# Patient Record
Sex: Male | Born: 1985 | Race: White | Hispanic: No | Marital: Married | Smoking: Never smoker
Health system: Southern US, Community
[De-identification: ages and names within clinical notes are randomized; demographics above are authoritative.]

---

## 2012-10-08 ENCOUNTER — Emergency Department (HOSPITAL_BASED_OUTPATIENT_CLINIC_OR_DEPARTMENT_OTHER)
Admission: EM | Admit: 2012-10-08 | Discharge: 2012-10-08 | Disposition: A | Discharge status: Home / Self Care | Payer: Worker's Compensation | Attending: Emergency Medicine | Admitting: Emergency Medicine

## 2012-10-08 ENCOUNTER — Encounter (HOSPITAL_BASED_OUTPATIENT_CLINIC_OR_DEPARTMENT_OTHER): Payer: Self-pay

## 2012-10-08 ENCOUNTER — Emergency Department (HOSPITAL_BASED_OUTPATIENT_CLINIC_OR_DEPARTMENT_OTHER): Payer: Worker's Compensation

## 2012-10-08 DIAGNOSIS — Y939 Activity, unspecified: Secondary | ICD-10-CM | POA: Insufficient documentation

## 2012-10-08 DIAGNOSIS — Z23 Encounter for immunization: Secondary | ICD-10-CM | POA: Insufficient documentation

## 2012-10-08 DIAGNOSIS — W269XXA Contact with unspecified sharp object(s), initial encounter: Secondary | ICD-10-CM | POA: Insufficient documentation

## 2012-10-08 DIAGNOSIS — Y929 Unspecified place or not applicable: Secondary | ICD-10-CM | POA: Insufficient documentation

## 2012-10-08 DIAGNOSIS — S81012A Laceration without foreign body, left knee, initial encounter: Secondary | ICD-10-CM

## 2012-10-08 DIAGNOSIS — S81009A Unspecified open wound, unspecified knee, initial encounter: Secondary | ICD-10-CM | POA: Insufficient documentation

## 2012-10-08 MED ORDER — TETANUS-DIPHTH-ACELL PERTUSSIS 5-2.5-18.5 LF-MCG/0.5 IM SUSP
0.5000 mL | Freq: Once | INTRAMUSCULAR | Status: AC
  Administered 2012-10-08: 0.5 mL via INTRAMUSCULAR
  Filled 2012-10-08: qty 0.5

## 2012-10-08 NOTE — ED Notes (Signed)
Suture cart is at the bedside set up and ready for the doctor to use. 

## 2012-10-08 NOTE — ED Notes (Signed)
Laceration to left knee.

## 2012-10-08 NOTE — ED Provider Notes (Signed)
CSN: [id number]     Arrival date & time 10/08/12  1059 History   First MD Initiated Contact with Patient 10/08/12 1211     Chief Complaint  Patient presents with  . Extremity Laceration   (Consider location/radiation/quality/duration/timing/severity/associated sxs/prior Treatment) Patient is a 27 y.o. male presenting with knee pain. The history is provided by the patient. No language interpreter was used.  Knee Pain Location:  Knee Knee location:  L knee Pain details:    Radiates to:  L leg   Severity:  No pain   Onset quality:  Sudden   Duration:  1 hour Chronicity:  New Foreign body present:  No foreign bodies Tetanus status:  Out of date Relieved by:  Nothing Worsened by:  Nothing tried Ineffective treatments:  None tried Pt has 2 cm laceration to left knee  History reviewed. No pertinent past medical history. History reviewed. No pertinent past surgical history. No family history on file. History  Substance Use Topics  . Smoking status: Never Smoker   . Smokeless tobacco: Not on file  . Alcohol Use: No    Review of Systems  Skin: Positive for wound.  All other systems reviewed and are negative.    Allergies  Review of patient's allergies indicates no known allergies.  Home Medications  No current outpatient prescriptions on file. BP 122/81  Pulse 64  Temp(Src) 98.2 F (36.8 C) (Oral)  Resp 16  Ht 6\' 1"  (1.854 m)  Wt 182 lb (82.555 kg)  BMI 24.02 kg/m2  SpO2 100% Physical Exam  Nursing note and vitals reviewed. Constitutional: He is oriented to person, place, and time. He appears well-developed and well-nourished.  HENT:  Head: Normocephalic.  Left Ear: External ear normal.  Neck: Normal range of motion.  Cardiovascular: Normal rate and normal heart sounds.   Pulmonary/Chest: Effort normal and breath sounds normal.  Abdominal: Soft.  Musculoskeletal: He exhibits tenderness.  Neurological: He is alert and oriented to person, place, and time. He has  normal reflexes.  Skin: There is erythema.  Psychiatric: He has a normal mood and affect.    ED Course  LACERATION REPAIR Date/Time: 10/08/2012 12:39 PM Performed by: Elson Areas Authorized by: Elson Areas Consent: Verbal consent obtained. Risks and benefits: risks, benefits and alternatives were discussed Required items: required blood products, implants, devices, and special equipment available Laceration length: 2 cm Foreign bodies: no foreign bodies Vascular damage: no Anesthesia: local infiltration Preparation: Patient was prepped and draped in the usual sterile fashion. Irrigation solution: saline Skin closure: staples Number of sutures: 2 Technique: simple Approximation: loose Approximation difficulty: simple Patient tolerance: Patient tolerated the procedure well with no immediate complications.   (including critical care time) Labs Review Labs Reviewed - No data to display Imaging Review No results found.  MDM  No diagnosis found.    Lonia Skinner [place], PA-C 10/08/12 1247

## 2012-10-09 NOTE — ED Provider Notes (Signed)
Medical screening examination/treatment/procedure(s) were performed by non-physician practitioner and as supervising physician I was immediately available for consultation/collaboration.   Junius Argyle, MD 10/09/12 1144

## 2012-10-18 ENCOUNTER — Encounter (HOSPITAL_BASED_OUTPATIENT_CLINIC_OR_DEPARTMENT_OTHER): Payer: Self-pay

## 2012-10-18 ENCOUNTER — Emergency Department (HOSPITAL_BASED_OUTPATIENT_CLINIC_OR_DEPARTMENT_OTHER)
Admission: EM | Admit: 2012-10-18 | Discharge: 2012-10-18 | Disposition: A | Discharge status: Home / Self Care | Payer: Worker's Compensation | Attending: Emergency Medicine | Admitting: Emergency Medicine

## 2012-10-18 DIAGNOSIS — Z4802 Encounter for removal of sutures: Secondary | ICD-10-CM | POA: Insufficient documentation

## 2012-10-18 NOTE — ED Notes (Signed)
Here for staple removal

## 2012-10-18 NOTE — ED Provider Notes (Signed)
CSN: [id number]     Arrival date & time 10/18/12  5284 History   First MD Initiated Contact with Patient 10/18/12 [phone]     Chief Complaint  Patient presents with  . Suture / Staple Removal   (Consider location/radiation/quality/duration/timing/severity/associated sxs/prior Treatment) HPI Comments: Pt had 3 staples to left knee ten day [id number] any problems with swelling or drainage  Patient is a 27 y.o. male presenting with suture removal. The history is provided by the patient. No language interpreter was used.  Suture / Staple Removal This is a new problem. The current episode started 1 to 4 weeks ago.    History reviewed. No pertinent past medical history. History reviewed. No pertinent past surgical history. No family history on file. History  Substance Use Topics  . Smoking status: Never Smoker   . Smokeless tobacco: Not on file  . Alcohol Use: No    Review of Systems  Respiratory: Negative.   Cardiovascular: Negative.     Allergies  Review of patient's allergies indicates no known allergies.  Home Medications  No current outpatient prescriptions on file. BP 126/78  Pulse 84  Temp(Src) 98.1 F (36.7 C) (Oral)  Resp 16  Ht 6' (1.829 m)  Wt 180 lb (81.647 kg)  BMI 24.41 kg/m2  SpO2 100% Physical Exam  Nursing note and vitals reviewed. Constitutional: He is oriented to person, place, and time. He appears well-developed and well-nourished.  Cardiovascular: Normal rate and regular rhythm.   Musculoskeletal: Normal range of motion.  Neurological: He is alert and oriented to person, place, and time.  Skin:  Well healing wound to the left knee:no drainage or redness to the area     ED Course  SUTURE REMOVAL Date/Time: 10/18/2012 9:57 AM Performed by: Teressa Lower Authorized by: Teressa Lower Risks and benefits: risks, benefits and alternatives were discussed Patient identity confirmed: verbally with patient Body area: lower extremity Location  details: left knee Wound Appearance: clean Staples Removed: 3 Facility: sutures placed in this facility Patient tolerance: Patient tolerated the procedure well with no immediate complications.   (including critical care time) Labs Review Labs Reviewed - No data to display Imaging Review No results found.  MDM   1. Removal of staples   removed without any problem   Teressa Lower, NP 10/18/12 [phone]

## 2012-10-18 NOTE — ED Provider Notes (Signed)
Medical screening examination/treatment/procedure(s) were performed by non-physician practitioner and as supervising physician I was immediately available for consultation/collaboration.  Geoffery Lyons, MD 10/18/12 1040

## 2015-01-28 HISTORY — PX: VASECTOMY: SHX75

## 2018-06-21 ENCOUNTER — Other Ambulatory Visit: Payer: Self-pay

## 2018-06-21 ENCOUNTER — Emergency Department (HOSPITAL_BASED_OUTPATIENT_CLINIC_OR_DEPARTMENT_OTHER): Payer: BLUE CROSS/BLUE SHIELD

## 2018-06-21 ENCOUNTER — Emergency Department (HOSPITAL_BASED_OUTPATIENT_CLINIC_OR_DEPARTMENT_OTHER)
Admission: EM | Admit: 2018-06-21 | Discharge: 2018-06-21 | Disposition: A | Discharge status: Home / Self Care | Payer: BLUE CROSS/BLUE SHIELD | Attending: Emergency Medicine | Admitting: Emergency Medicine

## 2018-06-21 ENCOUNTER — Encounter (HOSPITAL_BASED_OUTPATIENT_CLINIC_OR_DEPARTMENT_OTHER): Payer: Self-pay

## 2018-06-21 DIAGNOSIS — N4340 Spermatocele of epididymis, unspecified: Secondary | ICD-10-CM | POA: Diagnosis not present

## 2018-06-21 DIAGNOSIS — N50811 Right testicular pain: Secondary | ICD-10-CM | POA: Insufficient documentation

## 2018-06-21 DIAGNOSIS — R109 Unspecified abdominal pain: Secondary | ICD-10-CM | POA: Insufficient documentation

## 2018-06-21 LAB — CBC WITH DIFFERENTIAL/PLATELET
Abs Immature Granulocytes: 0.01 10*3/uL (ref 0.00–0.07)
Basophils Absolute: 0 10*3/uL (ref 0.0–0.1)
Basophils Relative: 0 %
Eosinophils Absolute: 0.1 10*3/uL (ref 0.0–0.5)
Eosinophils Relative: 1 %
HCT: 41.8 % (ref 39.0–52.0)
Hemoglobin: 14.3 g/dL (ref 13.0–17.0)
Immature Granulocytes: 0 %
Lymphocytes Relative: 32 %
Lymphs Abs: 2.2 10*3/uL (ref 0.7–4.0)
MCH: 30.8 pg (ref 26.0–34.0)
MCHC: 34.2 g/dL (ref 30.0–36.0)
MCV: 89.9 fL (ref 80.0–100.0)
Monocytes Absolute: 0.6 10*3/uL (ref 0.1–1.0)
Monocytes Relative: 9 %
Neutro Abs: 3.9 10*3/uL (ref 1.7–7.7)
Neutrophils Relative %: 58 %
Platelets: 262 10*3/uL (ref 150–400)
RBC: 4.65 MIL/uL (ref 4.22–5.81)
RDW: 11.6 % (ref 11.5–15.5)
WBC: 6.8 10*3/uL (ref 4.0–10.5)
nRBC: 0 % (ref 0.0–0.2)

## 2018-06-21 LAB — URINALYSIS, ROUTINE W REFLEX MICROSCOPIC
Bilirubin Urine: NEGATIVE
Glucose, UA: NEGATIVE mg/dL
Hgb urine dipstick: NEGATIVE
Ketones, ur: NEGATIVE mg/dL
Leukocytes,Ua: NEGATIVE
Nitrite: NEGATIVE
Protein, ur: NEGATIVE mg/dL
Specific Gravity, Urine: 1.01 (ref 1.005–1.030)
pH: 6 (ref 5.0–8.0)

## 2018-06-21 LAB — COMPREHENSIVE METABOLIC PANEL
ALT: 17 U/L (ref 0–44)
AST: 21 U/L (ref 15–41)
Albumin: 4.7 g/dL (ref 3.5–5.0)
Alkaline Phosphatase: 58 U/L (ref 38–126)
Anion gap: 9 (ref 5–15)
BUN: 15 mg/dL (ref 6–20)
CO2: 24 mmol/L (ref 22–32)
Calcium: 9.1 mg/dL (ref 8.9–10.3)
Chloride: 105 mmol/L (ref 98–111)
Creatinine, Ser: 0.88 mg/dL (ref 0.61–1.24)
GFR calc Af Amer: >60 mL/min (ref >60)
GFR calc non Af Amer: >60 mL/min (ref >60)
Glucose, Bld: 98 mg/dL (ref 70–99)
Potassium: 3.9 mmol/L (ref 3.5–5.1)
Sodium: 138 mmol/L (ref 135–145)
Total Bilirubin: 0.6 mg/dL (ref 0.3–1.2)
Total Protein: 7.2 g/dL (ref 6.5–8.1)

## 2018-06-21 LAB — LIPASE, BLOOD: Lipase: 28 U/L (ref 11–51)

## 2018-06-21 MED ORDER — ONDANSETRON HCL 4 MG/2ML IJ SOLN
4.0000 mg | Freq: Once | INTRAMUSCULAR | Status: AC
  Administered 2018-06-21: 13:00:00 4 mg via INTRAVENOUS
  Filled 2018-06-21: qty 2

## 2018-06-21 MED ORDER — MORPHINE SULFATE (PF) 4 MG/ML IV SOLN
4.0000 mg | Freq: Once | INTRAVENOUS | Status: AC
  Administered 2018-06-21: 4 mg via INTRAVENOUS
  Filled 2018-06-21: qty 1

## 2018-06-21 NOTE — ED Provider Notes (Signed)
[facility] EMERGENCY DEPARTMENT Provider Note   CSN: [id number] Arrival date & time: 06/21/18  1158    History   Chief Complaint Chief Complaint  Patient presents with   Abdominal Pain    HPI Eugene Harvey is a 33 y.o. male.     Eugene BibleCharles Aron is a 33 y.o. male who is otherwise healthy presents for evaluation of right testicular pain that radiates into the abdomen and right flank. Pain started last night, he reports a constant dull ache and that the pain worsens in waves.  He reports he has had some similar pain in the past but it is always been more mild.  He denies any noted testicular swelling or masses.  He has not had any dysuria, urinary frequency or hematuria.  He has not noticed any penile discharge.  He has had no associated fevers or chills, no nausea or vomiting.  He specifically states that pain started in the testicle and radiated into the abdomen.  He is sexually active but is in a monogamous relationship with his wife.  Reports he did have epididymitis about 2 years ago and this felt similarly, no other history of similar issues, no prior history of kidney stones.  No abdominal surgeries.  He has not taken anything for pain prior to arrival, no other aggravating or alleviating factors.     History reviewed. No pertinent past medical history.  There are no active problems to display for this patient.   Past Surgical History:  Procedure Laterality Date   VASECTOMY  2017        Home Medications    Prior to Admission medications   Not on File    Family History No family history on file.  Social History Social History   Tobacco Use   Smoking status: Never Smoker   Smokeless tobacco: Never Used  Substance Use Topics   Alcohol use: No   Drug use: No     Allergies   Patient has no known allergies.   Review of Systems Review of Systems  Constitutional: Negative for chills and fever.  Respiratory: Negative for cough and  shortness of breath.   Cardiovascular: Negative for chest pain.  Gastrointestinal: Positive for abdominal pain. Negative for nausea and vomiting.  Genitourinary: Positive for flank pain and testicular pain. Negative for discharge, dysuria, frequency, hematuria, penile pain, penile swelling and scrotal swelling.  Musculoskeletal: Negative for arthralgias and myalgias.  Skin: Negative for color change and rash.  Neurological: Negative for dizziness and light-headedness.     Physical Exam Updated Vital Signs BP 115/82 (BP Location: Left Arm)    Pulse 80    Temp 98.2 F (36.8 C) (Oral)    Resp 20    SpO2 100%   Physical Exam Vitals signs and nursing note reviewed. Exam conducted with a chaperone present.  Constitutional:      General: He is not in acute distress.    Appearance: He is well-developed and normal weight. He is not ill-appearing or diaphoretic.  HENT:     Head: Normocephalic and atraumatic.     Mouth/Throat:     Mouth: Mucous membranes are moist.     Pharynx: Oropharynx is clear.  Eyes:     General:        Right eye: No discharge.        Left eye: No discharge.     Pupils: Pupils are equal, round, and reactive to light.  Neck:     Musculoskeletal: Neck supple.  Cardiovascular:     Rate and Rhythm: Normal rate and regular rhythm.     Heart sounds: Normal heart sounds.  Pulmonary:     Effort: Pulmonary effort is normal. No respiratory distress.     Breath sounds: Normal breath sounds. No wheezing or rales.  Abdominal:     General: Bowel sounds are normal. There is no distension.     Palpations: Abdomen is soft. There is no mass.     Tenderness: There is no abdominal tenderness. There is no guarding.     Comments: Abdomen soft, nondistended, nontender to palpation in all quadrants without guarding or peritoneal signs, no focal tenderness in RLQ. No CVA tenderness bilaterally  Genitourinary:    Penis: Normal.      Scrotum/Testes:        Right: Tenderness present.  Mass or swelling not present.     Comments: Chaperone present during testicular exam. No external lesions noted, no lymphadenopathy. Penis normal, no discharge. Scrotum without erythema or edema  Right testicle tender to palpation, but without appreciable swelling or masses, not high-riding. Left testicle unremarkable No palpable hernia Musculoskeletal:        General: No deformity.  Skin:    General: Skin is warm and dry.     Capillary Refill: Capillary refill takes less than 2 seconds.  Neurological:     Mental Status: He is alert.     Coordination: Coordination normal.     Comments: Speech is clear, able to follow commands Moves extremities without ataxia, coordination intact  Psychiatric:        Mood and Affect: Mood normal.        Behavior: Behavior normal.      ED Treatments / Results  Labs (all labs ordered are listed, but only abnormal results are displayed) Labs Reviewed  URINALYSIS, ROUTINE W REFLEX MICROSCOPIC  COMPREHENSIVE METABOLIC PANEL  LIPASE, BLOOD  CBC WITH DIFFERENTIAL/PLATELET    EKG None  Radiology Ct Renal Stone Study  Result Date: 06/21/2018 CLINICAL DATA:  Right lower quadrant pain, right flank pain EXAM: CT ABDOMEN AND PELVIS WITHOUT CONTRAST TECHNIQUE: Multidetector CT imaging of the abdomen and pelvis was performed following the standard protocol without IV contrast. COMPARISON:  None. FINDINGS: Lower chest: No acute abnormality. Hepatobiliary: No focal liver abnormality is seen. No gallstones, gallbladder wall thickening, or biliary dilatation. Pancreas: Unremarkable. No pancreatic ductal dilatation or surrounding inflammatory changes. Spleen: Normal in size without focal abnormality. Adrenals/Urinary Tract: Adrenal glands are unremarkable. Kidneys are normal, without renal calculi, focal lesion, or hydronephrosis. Distended urinary bladder. Stomach/Bowel: Stomach is within normal limits. Appendix appears normal. No evidence of bowel wall  thickening, distention, or inflammatory changes. Vascular/Lymphatic: No significant vascular findings are present. No enlarged abdominal or pelvic lymph nodes. Reproductive: No mass or other abnormality. Other: No abdominal wall hernia or abnormality. No abdominopelvic ascites. Musculoskeletal: No acute or significant osseous findings. IMPRESSION: 1. No non-contrast CT findings to explain right lower quadrant or right flank pain. 2.  No evidence of urinary tract calculus or hydronephrosis. 3.  Normal appendix. 4.  Distended urinary bladder.  Correlate for urinary retention. Electronically Signed   By: Lauralyn Primes M.D.   On: 06/21/2018 14:24   US Scrotum W/doppler  Result Date: 06/21/2018 CLINICAL DATA:  Right testicular pain, right lower quadrant pain EXAM: SCROTAL ULTRASOUND DOPPLER ULTRASOUND OF THE TESTICLES TECHNIQUE: Complete ultrasound examination of the testicles, epididymis, and other scrotal structures was performed. Color and spectral Doppler ultrasound were also utilized to evaluate blood  flow to the testicles. COMPARISON:  None. FINDINGS: Right testicle Measurements: 4.9 x 2.3 x 3 cm. No mass or microlithiasis visualized. Left testicle Measurements: 4.7 x 2.3 x 2.9 cm. No mass or microlithiasis visualized. Right epididymis: Normal in size. 5 mm hypoechoic mass in the right epididymal head most consistent with a small spermatocele. Left epididymis:  Normal in size and appearance. Hydrocele:  None visualized. Varicocele:  None visualized. Pulsed Doppler interrogation of both testes demonstrates normal low resistance arterial and venous waveforms bilaterally. IMPRESSION: 1. No testicular torsion. 2. No focal testicular abnormality. Electronically Signed   By: Elige Ko   On: 06/21/2018 14:06    Procedures Procedures (including critical care time)  Medications Ordered in ED Medications  ondansetron (ZOFRAN) injection 4 mg (4 mg Intravenous Given 06/21/18 1254)  morphine 4 MG/ML injection 4 mg  (4 mg Intravenous Given 06/21/18 1254)     Initial Impression / Assessment and Plan / ED Course  I have reviewed the triage vital signs and the nursing notes.  Pertinent labs & imaging results that were available during my care of the patient were reviewed by me and considered in my medical decision making (see chart for details).  Pt presents for evaluation of right testicular pain radiating into the abdomen and flank that started last night.  History of similar but less severe pain in the past.  Normal vitals on arrival and overall well-appearing, no associated fevers, vomiting or.  On exam he does have some tenderness over the right testicle without palpable masses or notable swelling.  He does not have focal tenderness over the abdomen or flank, no peritoneal signs.  Patient has had epididymitis in the past and suspect this may be going on again, no trauma or injury to the area but could also have a testicular torsion.  Will check abdominal labs and testicular ultrasound, if ultrasound is unremarkable we will likely proceed to CT renal stone study to rule out kidney stone as cause for patient's pain.  Doubt appendicitis as patient has no focal pain in the right lower quadrant.  Labs are unremarkable, no leukocytosis, normal hemoglobin, no electrolyte derangements, normal renal liver function normal lipase, urinalysis without any signs of infection and no hematuria.  Testicular ultrasound shows no evidence of torsion and no focal masses or abnormalities, no signs of epididymitis, there is a 5 mm hypoechoic mass within the right epididymal head consistent with small spermatocele.  This could potentially be contributing to patient's pain but given the level of discomfort he is experiencing feel it is warranted to get CT renal stone study to rule out kidney stone as cause.  CT shows no evidence of urinary tract calculus or hydronephrosis, normal appendix is identified, there is some distention of the  urinary bladder the patient is having no difficulty urinating, no other acute findings within the abdomen or pelvis.  Discussed reassuring work-up with the patient he is much more comfortable after treatment of pain here in the ED.  We will have him follow-up with urology question of spermatocele could be related to pain especially since he has had a more mild form of this pain intermittently before.  Strict return precautions discussed.  Patient expresses understanding and agreement with this plan.  Discharged home in good condition.  Final Clinical Impressions(s) / ED Diagnoses   Final diagnoses:  Right testicular pain  Right flank pain  Spermatocele of epididymis    ED Discharge Orders    None  Dartha Lodge, PA-C 06/23/18 1840    Vanetta Mulders, MD 06/24/18 2126

## 2018-06-21 NOTE — Discharge Instructions (Signed)
Your CT scan shows no evidence of a kidney stone and your labs look great.  Your ultrasound does show a small cyst within the right epididymis which could be related to your pain but there is no evidence of infection or inflammation in this area on ultrasound and no evidence of testicular masses or torsion.  I would like for you to call and schedule follow-up appointment with urology regarding this.  In the meantime you can use Motrin Tylenol, ice, heat and supportive underwear to help with your pain.  Return to the ED if you have fevers, burning or pain with urination, blood in your urine, difficulty urinating, worsening pain in the testicle or abdomen or any other new or concerning symptoms.

## 2018-06-21 NOTE — ED Triage Notes (Signed)
C/o RLQ pain/flank pain day 2-NAD-steady gait

## 2018-06-21 NOTE — ED Notes (Signed)
Pt verbalized understanding of dc instructions.

## 2020-07-24 IMAGING — US ULTRASOUND SCROTUM DOPPLER COMPLETE
1 series · 14 of 25 positions shown · non-contrast
Comparison: None.

CLINICAL DATA: Right testicular pain, right lower quadrant pain

EXAM:
SCROTAL ULTRASOUND
DOPPLER ULTRASOUND OF THE TESTICLES
TECHNIQUE: Complete ultrasound examination of the testicles, epididymis, and
other scrotal structures was performed. Color and spectral Doppler
ultrasound were also utilized to evaluate blood flow to the
testicles.

[Series 1: ultrasound scrotum doppler complete · 14 of 63 slices shown]
[im 1/63]
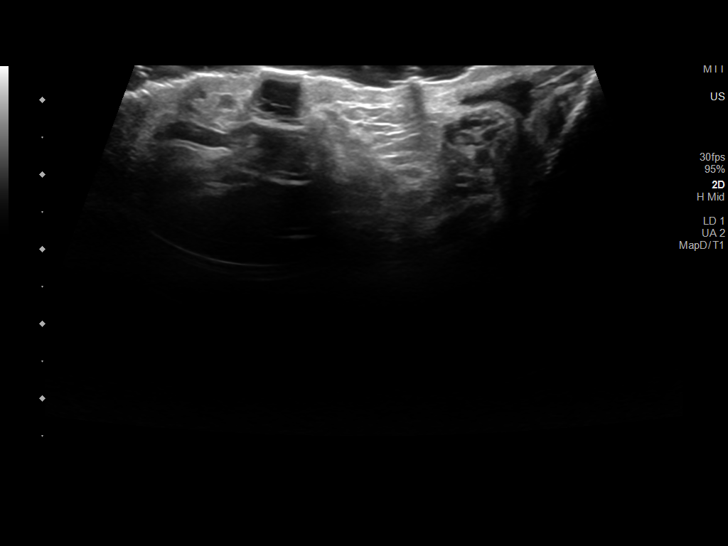
[im 6/63]
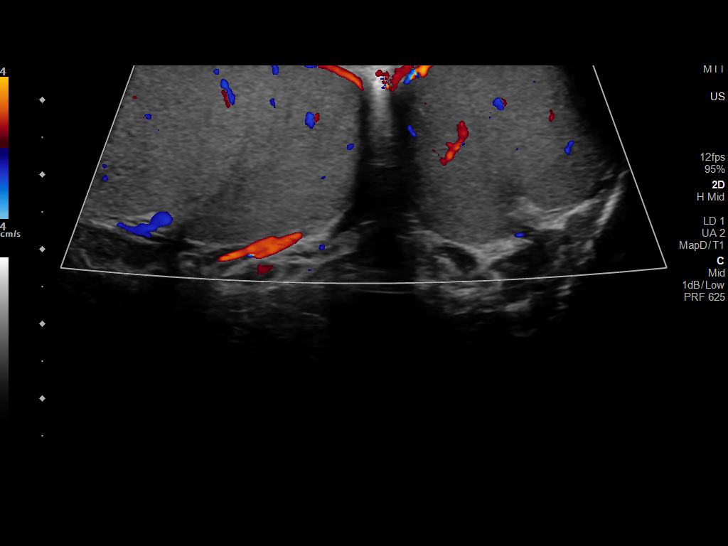
[im 11/63]
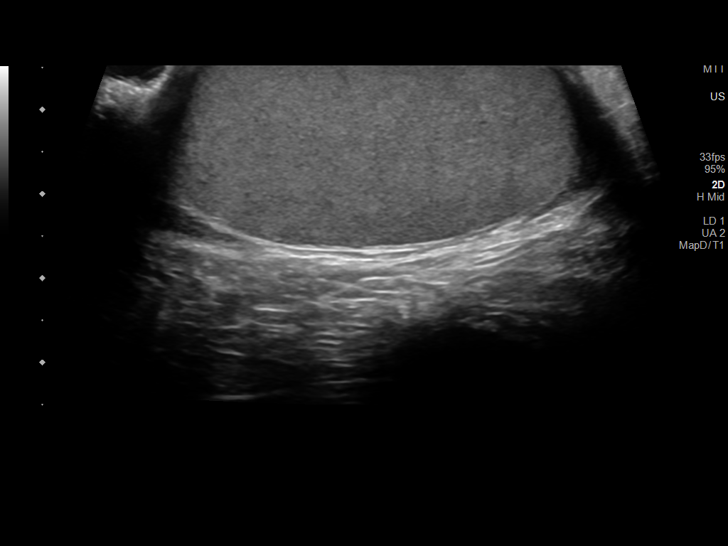
[im 16/63]
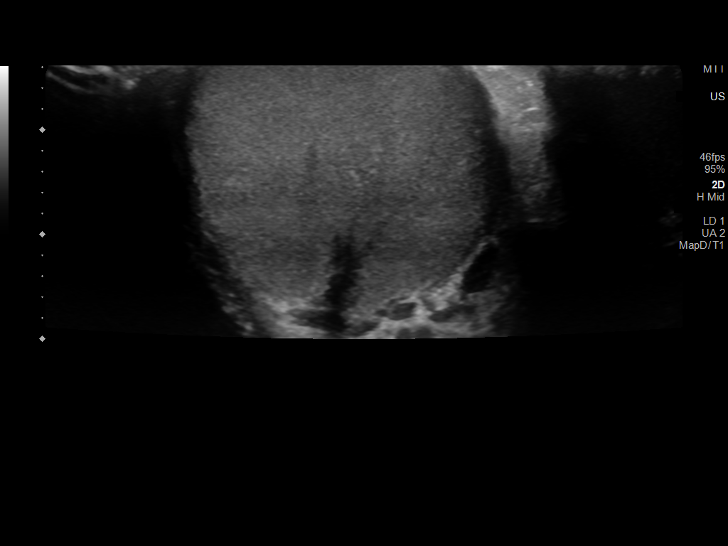
[im 21/63]
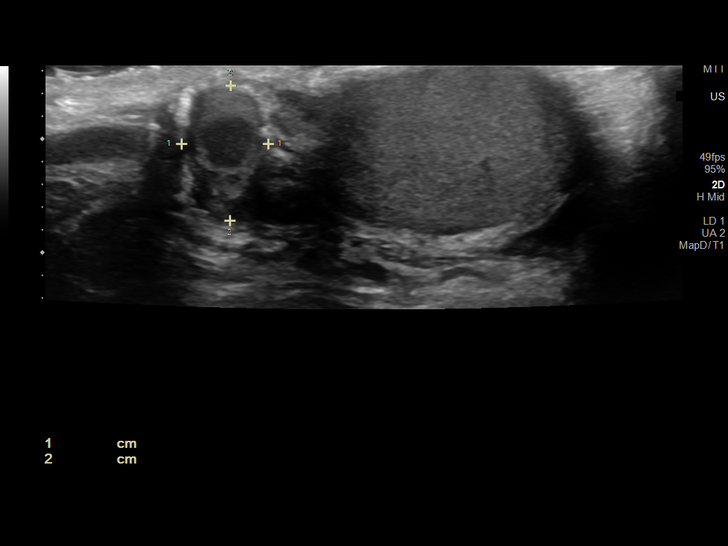
[im 24/63]
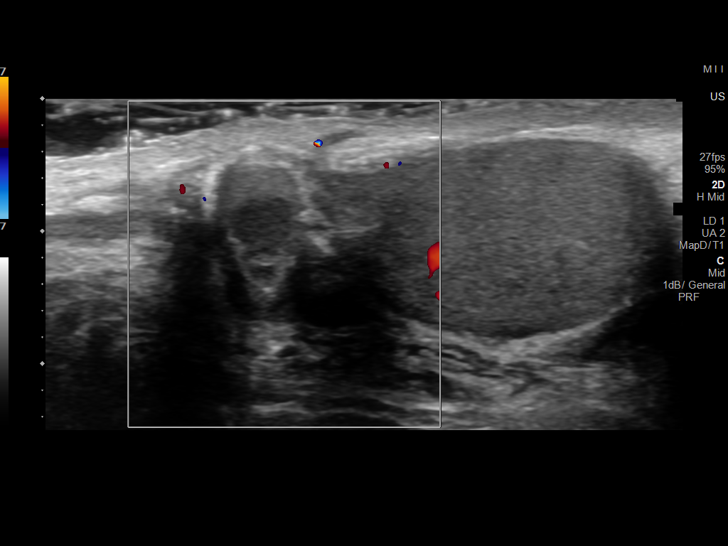
[im 29/63]
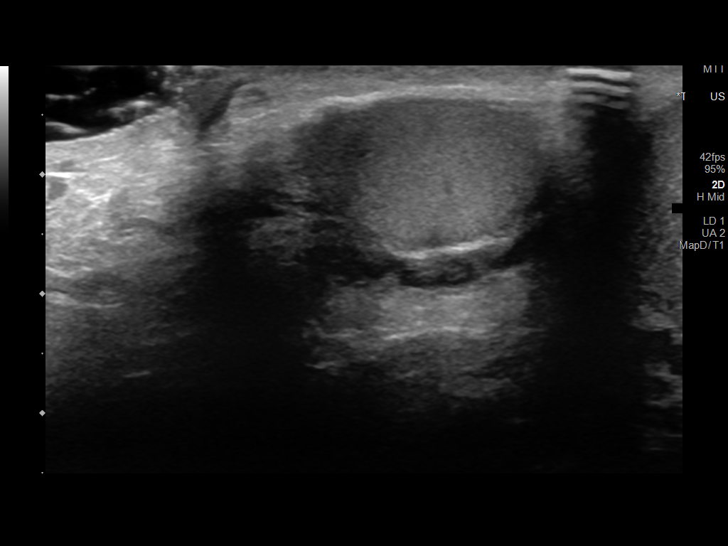
[im 34/63]
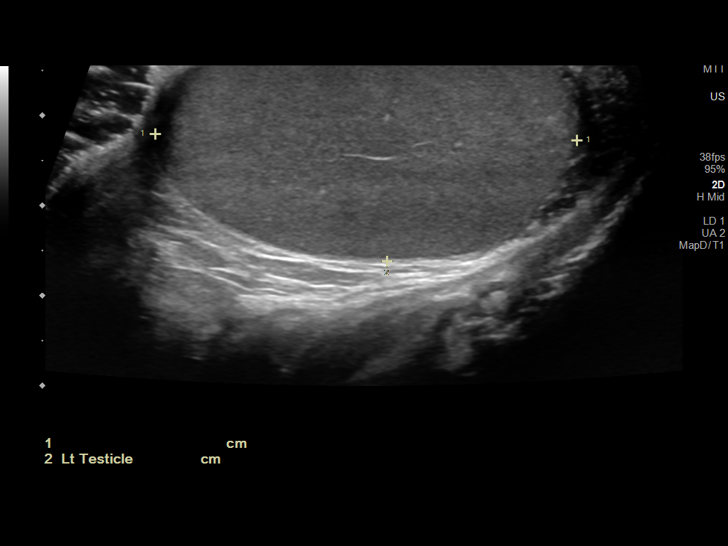
[im 39/63]
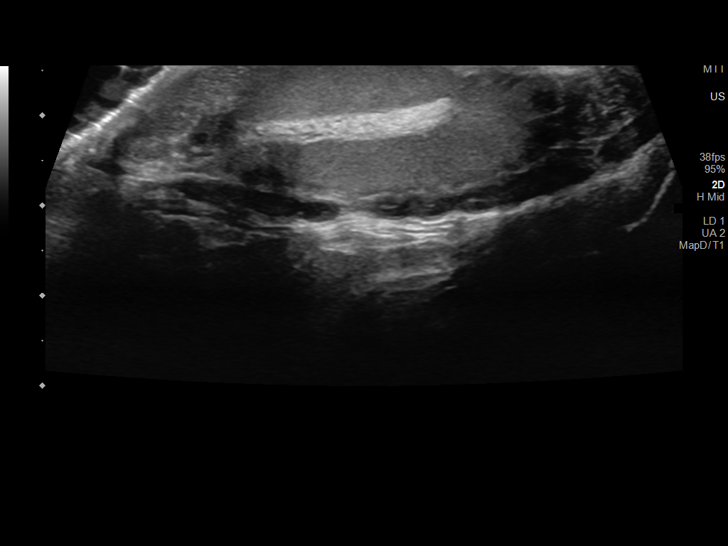
[im 42/63]
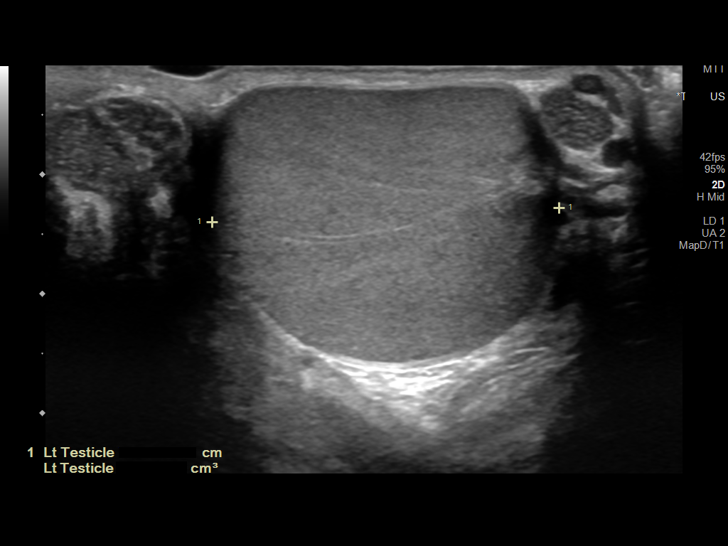
[im 47/63]
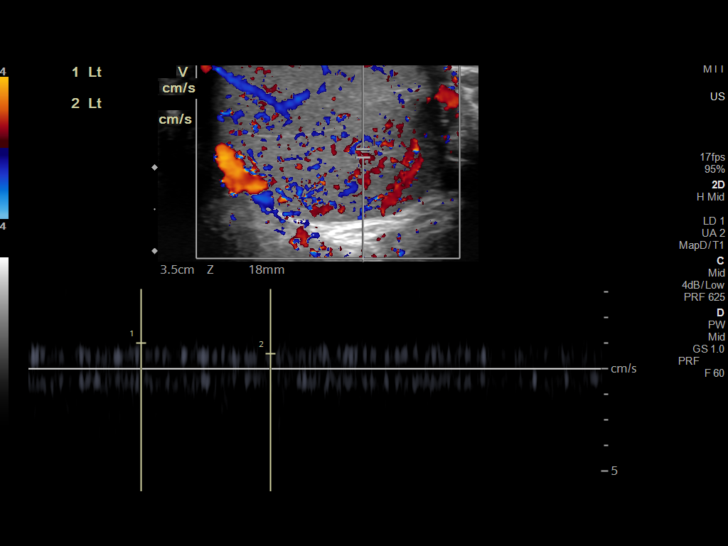
[im 52/63]
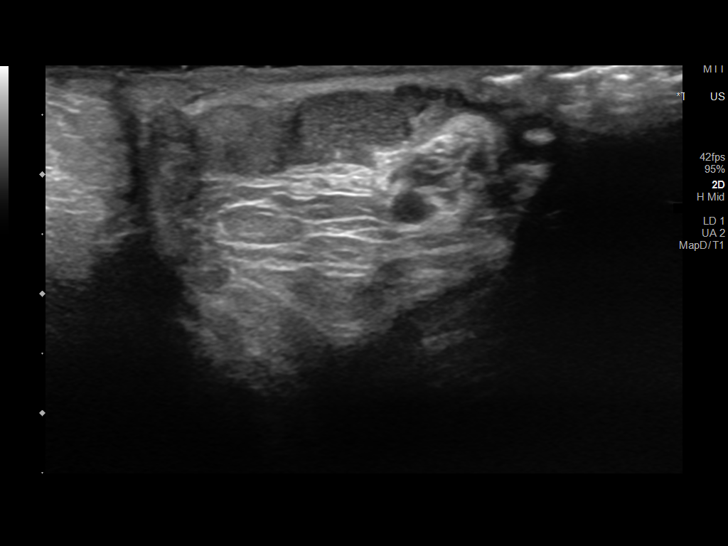
[im 57/63]
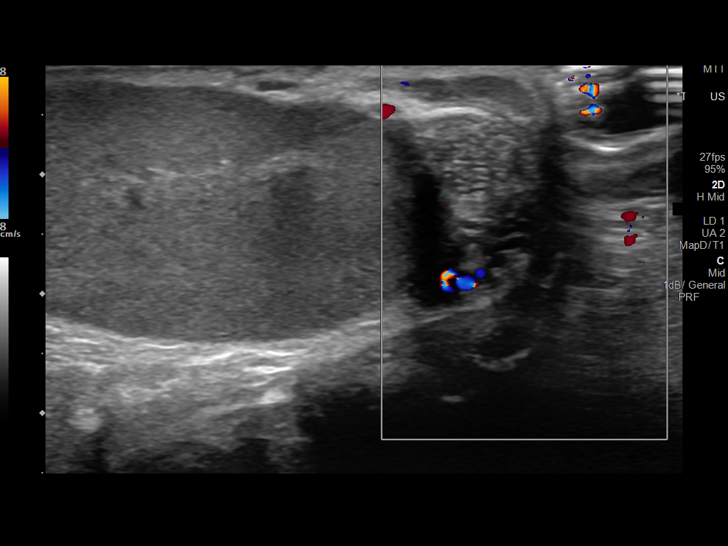
[im 63/63]
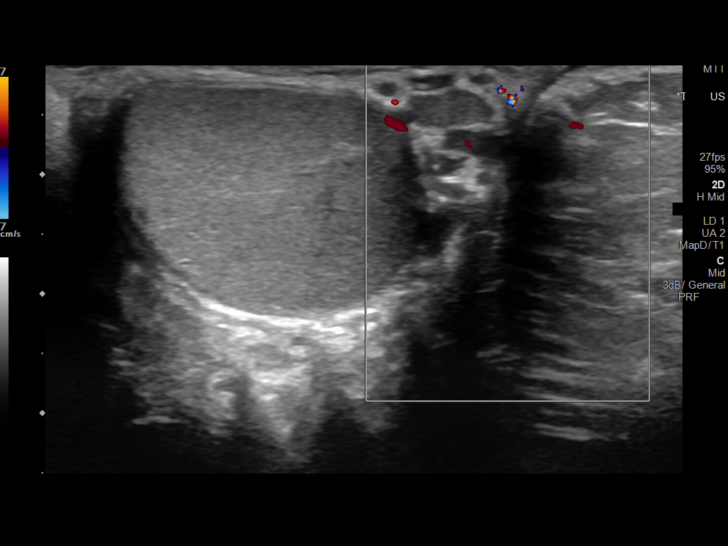

[14 of 25 positions shown; findings below may reference images not displayed]

FINDINGS: Right testicle

Measurements: 4.9 x 2.3 x 3 cm. No mass or microlithiasis
visualized.

Left testicle

Measurements: 4.7 x 2.3 x 2.9 cm. No mass or microlithiasis
visualized.

Right epididymis: Normal in size. 5 mm hypoechoic mass in the right
epididymal head most consistent with a small spermatocele.

Left epididymis:  Normal in size and appearance.

Hydrocele:  None visualized.

Varicocele:  None visualized.

Pulsed Doppler interrogation of both testes demonstrates normal low
resistance arterial and venous waveforms bilaterally.
IMPRESSION: 1. No testicular torsion.
2. No focal testicular abnormality.

## 2020-07-24 IMAGING — CT CT RENAL STONE PROTOCOL
2 of 4 series · 16 of 46 positions shown, 18 images · non-contrast
Comparison: None.

CLINICAL DATA: Right lower quadrant pain, right flank pain

EXAM:
CT ABDOMEN AND PELVIS WITHOUT CONTRAST
TECHNIQUE: Multidetector CT imaging of the abdomen and pelvis was performed
following the standard protocol without IV contrast.

[Series 2: axial st · axial · 0.86mm/px · z∈[-654,-189]mm · 13 of 103 slices shown, 15 images]
[im 5/103  soft-tissue]
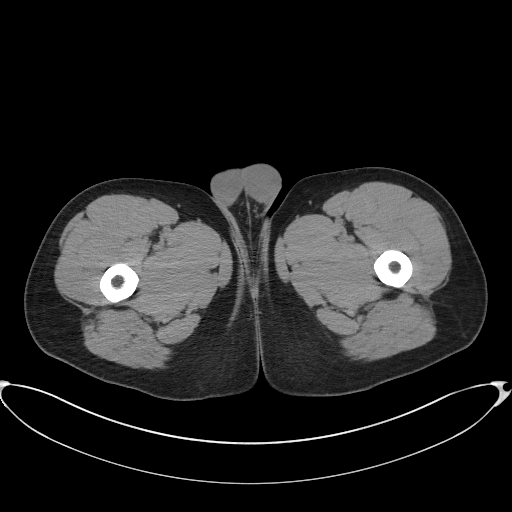
[im 5/103  bone]
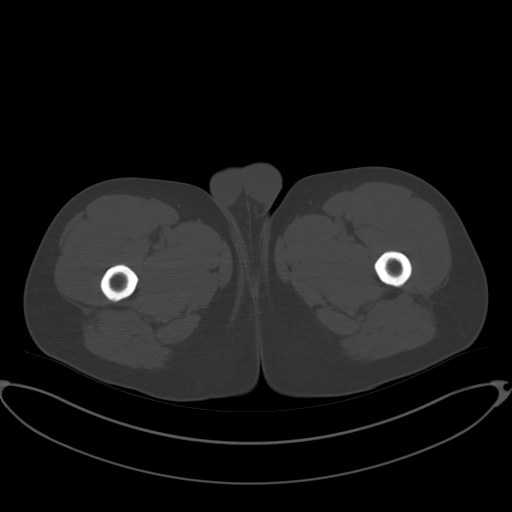
[im 13/103  soft-tissue]
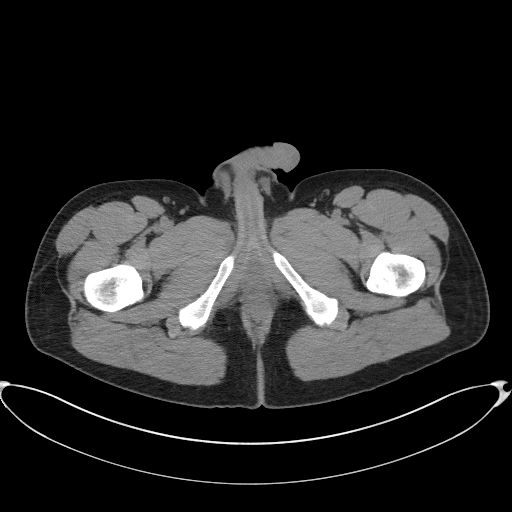
[im 21/103  soft-tissue]
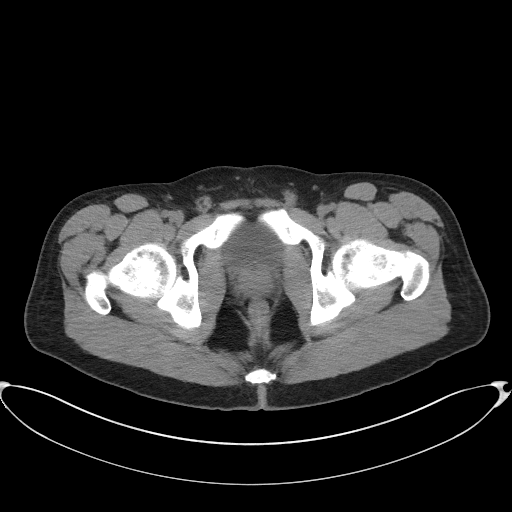
[im 29/103  soft-tissue]
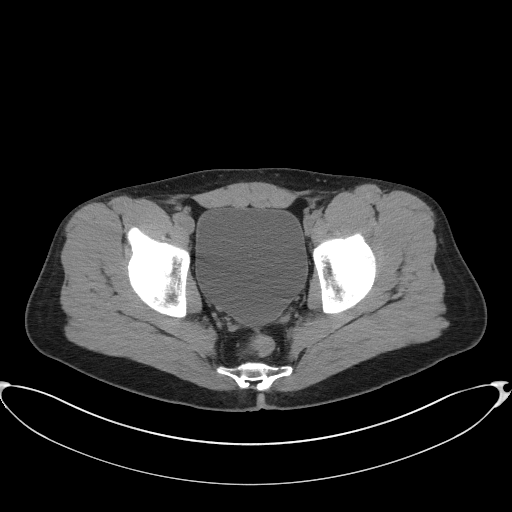
[im 37/103  soft-tissue]
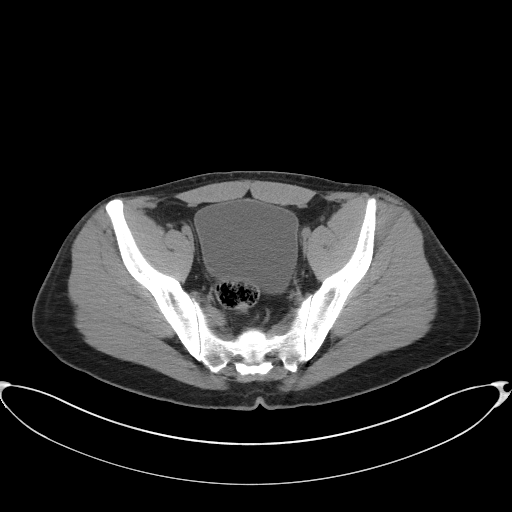
[im 45/103  soft-tissue]
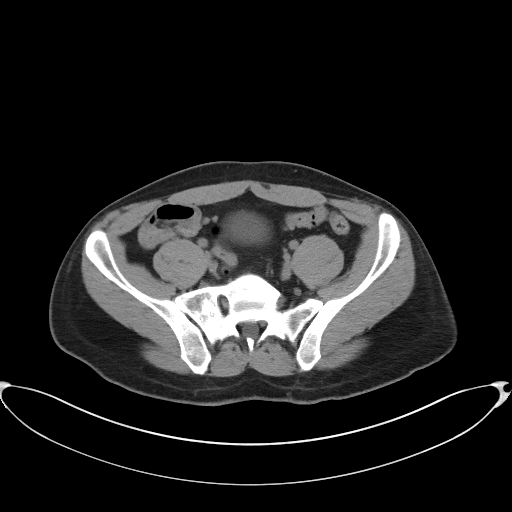
[im 54/103  soft-tissue]
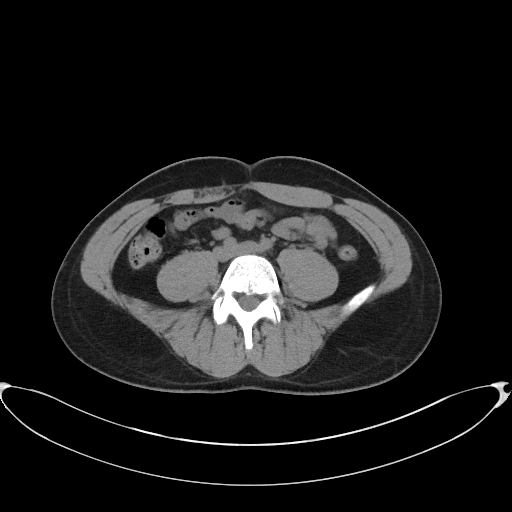
[im 58/103  soft-tissue]
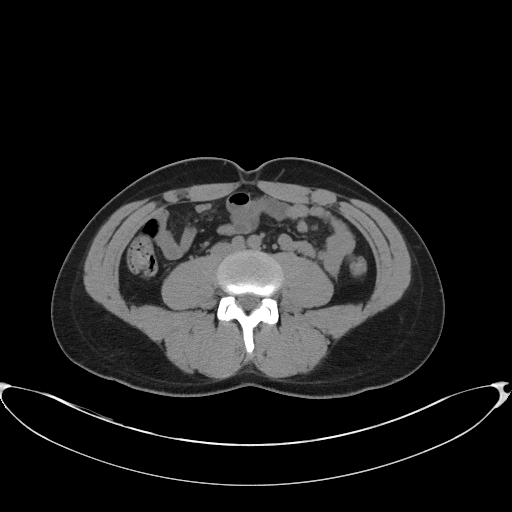
[im 66/103  soft-tissue]
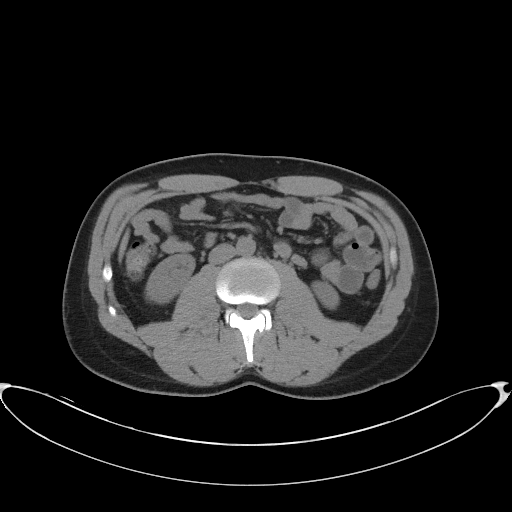
[im 66/103  bone]
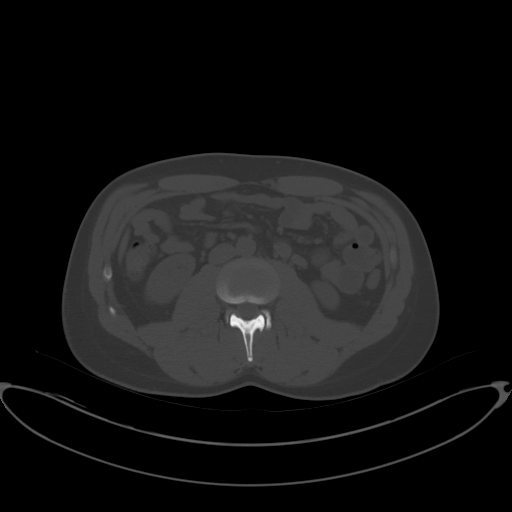
[im 74/103  soft-tissue]
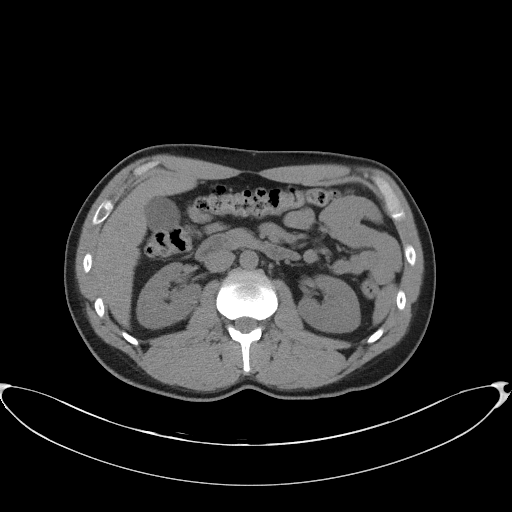
[im 82/103  soft-tissue]
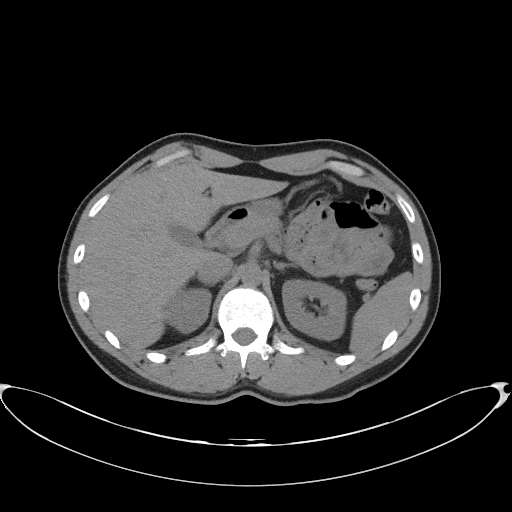
[im 90/103  soft-tissue]
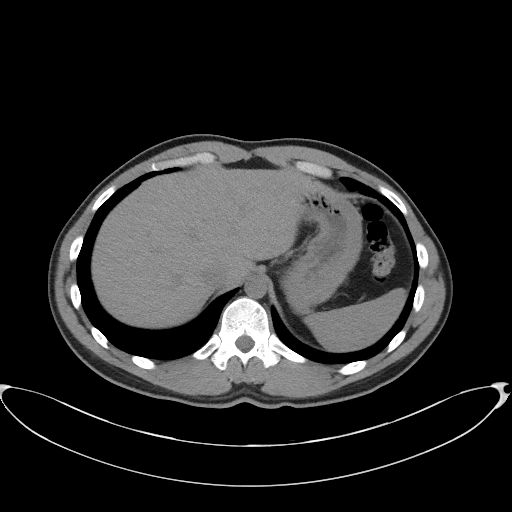
[im 98/103  soft-tissue]
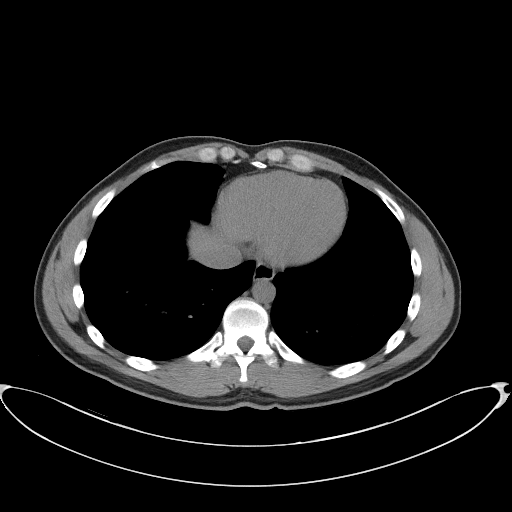

[Series 4: coronal st · coronal · 0.76mm/px · 3 of 86 slices shown]
[im 29/86  soft-tissue]
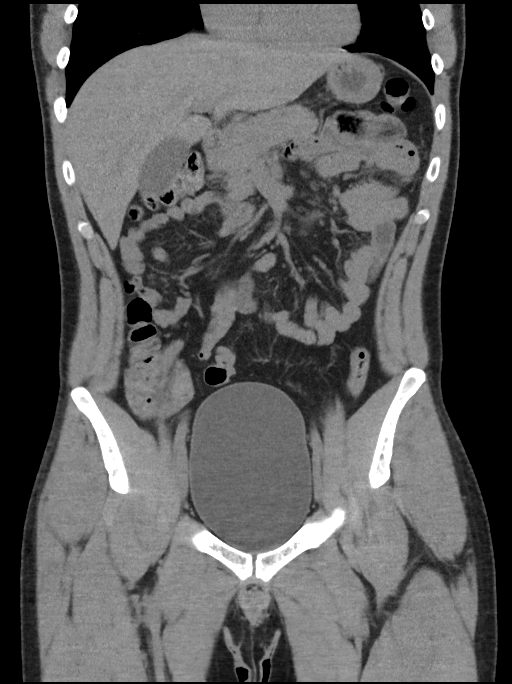
[im 38/86  soft-tissue]
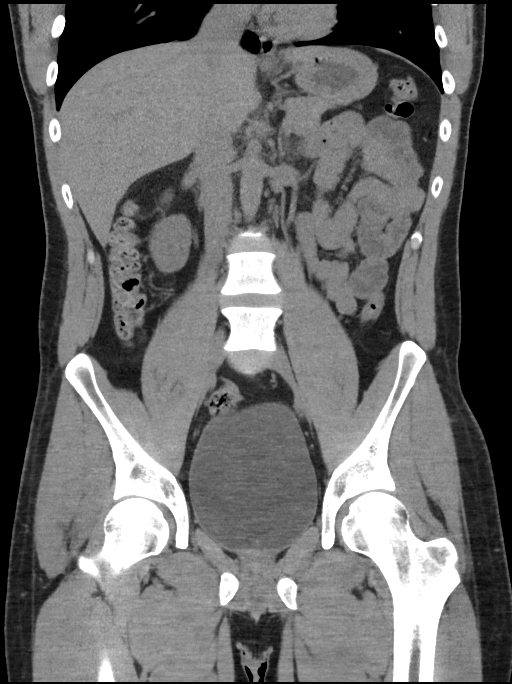
[im 48/86  soft-tissue]
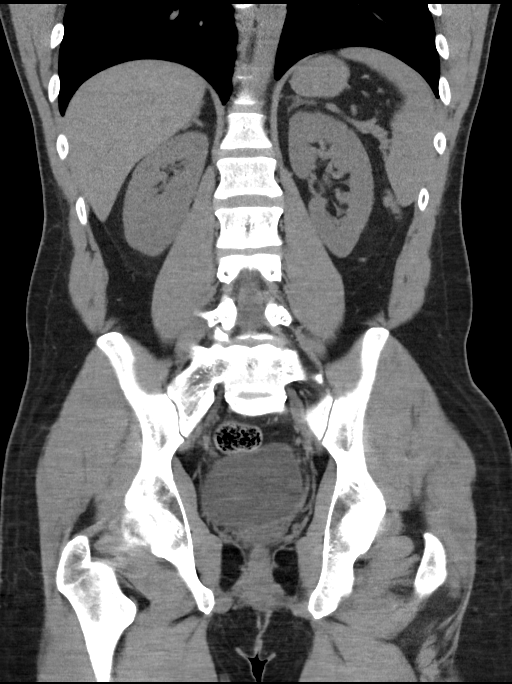

[16 of 46 positions shown; findings below may reference images not displayed]

FINDINGS: Lower chest: No acute abnormality.

Hepatobiliary: No focal liver abnormality is seen. No gallstones,
gallbladder wall thickening, or biliary dilatation.

Pancreas: Unremarkable. No pancreatic ductal dilatation or
surrounding inflammatory changes.

Spleen: Normal in size without focal abnormality.

Adrenals/Urinary Tract: Adrenal glands are unremarkable. Kidneys are
normal, without renal calculi, focal lesion, or hydronephrosis.
Distended urinary bladder.

Stomach/Bowel: Stomach is within normal limits. Appendix appears
normal. No evidence of bowel wall thickening, distention, or
inflammatory changes.

Vascular/Lymphatic: No significant vascular findings are present. No
enlarged abdominal or pelvic lymph nodes.

Reproductive: No mass or other abnormality.

Other: No abdominal wall hernia or abnormality. No abdominopelvic
ascites.

Musculoskeletal: No acute or significant osseous findings.
IMPRESSION: 1. No non-contrast CT findings to explain right lower quadrant or
right flank pain.

2.  No evidence of urinary tract calculus or hydronephrosis.

3.  Normal appendix.

4.  Distended urinary bladder.  Correlate for urinary retention.
